# Patient Record
Sex: Male | Born: 1995 | Race: White | Hispanic: No | Marital: Single | Smoking: Never smoker
Health system: Southern US, Community
[De-identification: ages and names within clinical notes are randomized; demographics above are authoritative.]

---
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
## 2018-12-17 ENCOUNTER — Other Ambulatory Visit: Payer: Self-pay

## 2018-12-17 DIAGNOSIS — Z20822 Contact with and (suspected) exposure to covid-19: Secondary | ICD-10-CM

## 2018-12-18 LAB — NOVEL CORONAVIRUS, NAA: SARS-CoV-2, NAA: NOT DETECTED

## 2019-03-25 ENCOUNTER — Ambulatory Visit
Admission: EM | Admit: 2019-03-25 | Discharge: 2019-03-25 | Disposition: A | Discharge status: Home / Self Care | Payer: BC Managed Care – PPO | Attending: Urgent Care | Admitting: Urgent Care

## 2019-03-25 ENCOUNTER — Other Ambulatory Visit: Payer: Self-pay

## 2019-03-25 DIAGNOSIS — T22212A Burn of second degree of left forearm, initial encounter: Secondary | ICD-10-CM

## 2019-03-25 DIAGNOSIS — X19XXXA Contact with other heat and hot substances, initial encounter: Secondary | ICD-10-CM

## 2019-03-25 MED ORDER — SILVER SULFADIAZINE 1 % EX CREA: TOPICAL_CREAM | Freq: Once | CUTANEOUS | Status: DC

## 2019-03-25 MED ORDER — SILVER SULFADIAZINE 1 % EX CREA: 1.0000 "application " | TOPICAL_CREAM | Freq: Two times a day (BID) | CUTANEOUS | 0 refills | Status: DC

## 2019-03-25 NOTE — Discharge Instructions (Signed)
It was very nice seeing you today in clinic. Thank you for entrusting me with your care.   Keep area clean and dry. Monitor for signs and symptoms of infection, which would include increased redness, swelling, streaking, drainage, pain, and the development of a fever. Apply cream TWICE a day for 7 days. Keep covered to keep cream in contact longer.    Make arrangements to follow up with your regular doctor in 1 week for re-evaluation if not improving. If your symptoms/condition worsens, please seek follow up care either here or in the ER. Please remember, our [facility] providers are "right here with you" when you need [place].   Again, it was my pleasure to take care of you today. Thank you for choosing our clinic. I hope that you start to feel better quickly.   Honor Loh, MSN, APRN, FNP-C, CEN Advanced Practice Provider [facility]

## 2019-03-25 NOTE — ED Triage Notes (Signed)
Patient states that he burned his arm on a machine at work on Saturday. Patient states that arm has been sore.

## 2019-03-27 NOTE — ED Provider Notes (Signed)
[place], [place]   Name: Jerry Long DOB: 12-23-1995 MRN: [id number] CSN: [id number] PCP: Patient, No Pcp Per  Arrival date and time:  03/25/19 1656  Chief Complaint:  Burn (left arm)   NOTE: Prior to seeing the patient today, I have reviewed the triage nursing documentation and vital signs. Clinical staff has updated patient's PMH/PSHx, current medication list, and drug allergies/intolerances to ensure comprehensive history available to assist in medical decision making.   History:   HPI: Jerry Long is a 23 y.o. male who presents today with complaints of with a burn to the distal aspect of his LEFT forearm. Patient reports that he was performing his job duties today at [employer] when the injury occurred, however does to wish to file this under a worker's compensation claim. He advises that he was using a new piece of equipment at work that he was not familiar with. In the process of working with the machine, his arm came into contact with a part that was hot, thus causing a thermal injury to the aforementioned location. Injury occurred on Saturday 03/21/2019. Patient notes that his tattoo appeared different today; skin ruptured and rolled edges. He has been applying TAO and using antiseptic wipes. He presents for evaluation to ensure proper healing and no infection.   History reviewed. No pertinent past medical history.  History reviewed. No pertinent surgical history.  History reviewed. No pertinent family history.  Social History   Tobacco Use  . Smoking status: Never Smoker  . Smokeless tobacco: Never Used  Substance Use Topics  . Alcohol use: Not Currently  . Drug use: Not Currently    There are no problems to display for this patient.   Home Medications:    No outpatient medications have been marked as taking for the 03/25/19 encounter [name]).    Allergies:   Patient has no known allergies.  Review of Systems (ROS): Review of Systems    Constitutional: Negative for chills and fever.  Respiratory: Negative for cough and shortness of breath.   Cardiovascular: Negative for chest pain and palpitations.  Skin: Positive for color change and wound.  All other systems reviewed and are negative.    Vital Signs: Today's Vitals   03/25/19 1725 03/25/19 1727  BP:  (!) 141/82  Pulse:  (!) 103  Resp:  16  Temp:  99.2 F (37.3 C)  TempSrc:  Oral  SpO2:  100%  Weight: 150 lb (68 kg)   Height: 5\' 3"  (1.6 m)   PainSc: 2      Physical Exam: Physical Exam  Constitutional: He is oriented to person, place, and time and well-developed, well-nourished, and in no distress.  HENT:  Head: Normocephalic and atraumatic.  Mouth/Throat: Mucous membranes are normal.  Eyes: Pupils are equal, round, and reactive to light.  Cardiovascular: Intact distal pulses. Tachycardia present.  Pulmonary/Chest: Effort normal. No respiratory distress.  Neurological: He is alert and oriented to person, place, and time. Gait normal.  Skin: Skin is warm and dry. Burn (see marked location) noted. No rash noted.     Partial thickness burn to distal LFA. Sensation intact. No blister; ? ruptured already. Skin rolled causing distortion to underlying tattoo. Pain minimal. (+) slight surrounding erythema.   Psychiatric: Mood, memory, affect and judgment normal.  Nursing note and vitals reviewed.   Urgent Care Treatments / Results:   Orders Placed This Encounter  Procedures  . Apply dressing    LABS: PLEASE NOTE: all labs that  were ordered this encounter are listed, however only abnormal results are displayed. Labs Reviewed - No data to display  EKG: -None  RADIOLOGY: No results found.  PROCEDURES: Procedures  MEDICATIONS RECEIVED THIS VISIT: Medications - No data to display  PERTINENT CLINICAL COURSE NOTES/UPDATES:   Initial Impression / Assessment and Plan / Urgent Care Course:  Pertinent labs & imaging results that were available during  my care of the patient were personally reviewed by me and considered in my medical decision making (see lab/imaging section of note for values and interpretations).  Jerry Long is a 23 y.o. male who presents to [facility] today with complaints of Burn (left arm)   Patient is well appearing overall in clinic today. He does not appear to be in any acute distress. Presenting symptoms (see HPI) and exam as documented above. Exam consistent with healing partial thickness burn that occurred several days ago. Wound changed today after using an antiseptic wipe. Pain minimal; sensation intact. Discussed potential for infection with wound essentially being open at this point. Wound cleansed and dressing in clinic by CMA; SSD 1% applied. Patient educated on need for daily wound care. He was encouraged to keep wound clean and dry. He was instructed to apply SSD 1% cream as prescribed. Wound may be left open to air while at home, however he was encouraged to cover area while in public to prevent infection. Patient to monitor for signs and symptoms of infection, which would include increased redness, swelling, streaking, drainage, pain, and the development of a fever. May use Tylenol and/or Ibuprofen as needed for pain.  Discussed follow up with primary care physician in 1 week for re-evaluation if not improving. I have reviewed the follow up and strict return precautions for any new or worsening symptoms. Patient is aware of symptoms that would be deemed urgent/emergent, and would thus require further evaluation either here or in the emergency department. At the time of discharge, he verbalized understanding and consent with the discharge plan as it was reviewed with him. All questions were fielded by provider and/or clinic staff prior to patient discharge.    Final Clinical Impressions / Urgent Care Diagnoses:   Final diagnoses:  Partial thickness burn of left forearm, initial encounter    New  Prescriptions:  [place] Controlled Substance Registry consulted? Not Applicable    Meds ordered this encounter  Medications  . silver sulfADIAZINE (SILVADENE) 1 % cream    Sig: Apply 1 application topically 2 (two) times daily. X 7 days    Dispense:  85 g    Refill:  0  . silver sulfADIAZINE (SILVADENE) 1 % cream   Recommended Follow up Care:  Patient encouraged to follow up with the following provider within the specified time frame, or sooner as dictated by the severity of his symptoms. As always, he was instructed that for any urgent/emergent care needs, he should seek care either here or in the emergency department for more immediate evaluation.  Follow-up Information    PCP In 1 week.   Why: General reassessment of symptoms if not improving        NOTE: This note was prepared using Lobbyist along with smaller Company secretary. Despite my best ability to proofread, there is the potential that transcriptional errors may still occur from this process, and are completely unintentional.    Karen Kitchens, NP 03/27/19 1120

## 2019-06-22 ENCOUNTER — Other Ambulatory Visit: Payer: Self-pay

## 2019-06-22 ENCOUNTER — Ambulatory Visit
Admission: EM | Admit: 2019-06-22 | Discharge: 2019-06-22 | Disposition: A | Discharge status: Home / Self Care | Payer: BC Managed Care – PPO | Attending: Family Medicine | Admitting: Family Medicine

## 2019-06-22 DIAGNOSIS — L0591 Pilonidal cyst without abscess: Secondary | ICD-10-CM | POA: Diagnosis not present

## 2019-06-22 MED ORDER — AMOXICILLIN-POT CLAVULANATE 875-125 MG PO TABS: 1.0000 | ORAL_TABLET | Freq: Two times a day (BID) | ORAL | 0 refills | Status: DC

## 2019-06-22 NOTE — ED Provider Notes (Signed)
[facility]    CSN: [id number] Arrival date & time: 06/22/19  0934      History   Chief Complaint Chief Complaint  Patient presents with  . Rectal Pain    HPI Jerry Long is a 24 y.o. male.   24 yo male with a c/o pus drainage and tenderness in the tailbone area, just above the anus. Denies any injuries, fevers, chills. States he's been doing bathtub soaks and using baby wipes. Denies any prior history of similar problems.      History reviewed. No pertinent past medical history.  There are no problems to display for this patient.   Past Surgical History:  Procedure Laterality Date  . HERNIA REPAIR  1998   per patient        Home Medications    Prior to Admission medications   Medication Sig Start Date End Date Taking? Authorizing Provider  amoxicillin-clavulanate (AUGMENTIN) 875-125 MG tablet Take 1 tablet by mouth 2 (two) times daily. 06/22/19   Norval Gable, MD  silver sulfADIAZINE (SILVADENE) 1 % cream Apply 1 application topically 2 (two) times daily. X 7 days 03/25/19   Karen Kitchens, NP    Family History History reviewed. No pertinent family history.  Social History Social History   Tobacco Use  . Smoking status: Never Smoker  . Smokeless tobacco: Never Used  Substance Use Topics  . Alcohol use: Yes    Comment: rarely   . Drug use: Not Currently     Allergies   Patient has no known allergies.   Review of Systems Review of Systems   Physical Exam Triage Vital Signs ED Triage Vitals  Enc Vitals Group     BP 06/22/19 0946 (!) 141/97     Pulse Rate 06/22/19 0946 (!) 106     Resp 06/22/19 0946 18     Temp 06/22/19 0946 98.7 F (37.1 C)     Temp Source 06/22/19 0946 Oral     SpO2 06/22/19 0946 99 %     Weight --      Height --      Head Circumference --      Peak Flow --      Pain Score 06/22/19 0944 5     Pain Loc --      Pain Edu? --      Excl. in [place]? --    No data found.  Updated Vital Signs BP  (!) 141/97 (BP Location: Right Arm)   Pulse (!) 106   Temp 98.7 F (37.1 C) (Oral)   Resp 18   SpO2 99%   Visual Acuity Right Eye Distance:   Left Eye Distance:   Bilateral Distance:    Right Eye Near:   Left Eye Near:    Bilateral Near:     Physical Exam Vitals and nursing note reviewed.  Constitutional:      General: He is not in acute distress.    Appearance: He is not toxic-appearing or diaphoretic.  Genitourinary:    Comments: Pinpoint, oval wound at sacral skin area with purulent drainage, and surrounding blanchable erythema with tenderness to palpation Neurological:     Mental Status: He is alert.      UC Treatments / Results  Labs (all labs ordered are listed, but only abnormal results are displayed) Labs Reviewed - No data to display  EKG   Radiology No results found.  Procedures Procedures (including critical care time)  Medications Ordered in UC Medications -  No data to display  Initial Impression / Assessment and Plan / UC Course  I have reviewed the triage vital signs and the nursing notes.  Pertinent labs & imaging results that were available during my care of the patient were reviewed by me and considered in my medical decision making (see chart for details).      Final Clinical Impressions(s) / UC Diagnoses   Final diagnoses:  Infected pilonidal cyst     Discharge Instructions     Warm compresses to area    ED Prescriptions    Medication Sig Dispense Auth. Provider   amoxicillin-clavulanate (AUGMENTIN) 875-125 MG tablet Take 1 tablet by mouth 2 (two) times daily. 20 tablet Payton Mccallum, MD     1.diagnosis reviewed with patient 2. rx as per orders above; reviewed possible side effects, interactions, risks and benefits  3. Recommend supportive treatment sitz baths, warm compresses, over the counter analgesics as needed 4. Follow-up prn if symptoms worsen or don't improve   PDMP not reviewed this encounter.   Payton Mccallum, MD 06/22/19 1459

## 2019-06-22 NOTE — Discharge Instructions (Signed)
Warm compresses to area °

## 2019-06-22 NOTE — ED Triage Notes (Signed)
Pt states he feels like he has another hole near rectum that he noticed about one week ago.  Reports dishcarge.  Worsening pain.  Warm bath did not relieve pain.  Had one BM with bright red blood.  Painful BMs with "pressure".

## 2019-08-11 DIAGNOSIS — L0591 Pilonidal cyst without abscess: Secondary | ICD-10-CM

## 2019-08-11 HISTORY — DX: Pilonidal cyst without abscess: L05.91

## 2021-08-31 ENCOUNTER — Emergency Department: Payer: BC Managed Care – PPO

## 2021-08-31 ENCOUNTER — Other Ambulatory Visit: Payer: Self-pay

## 2021-08-31 ENCOUNTER — Emergency Department
Admission: EM | Admit: 2021-08-31 | Discharge: 2021-08-31 | Disposition: A | Discharge status: Home / Self Care | Payer: BC Managed Care – PPO | Attending: Emergency Medicine | Admitting: Emergency Medicine

## 2021-08-31 DIAGNOSIS — R1031 Right lower quadrant pain: Secondary | ICD-10-CM | POA: Diagnosis present

## 2021-08-31 DIAGNOSIS — N200 Calculus of kidney: Secondary | ICD-10-CM | POA: Diagnosis not present

## 2021-08-31 LAB — COMPREHENSIVE METABOLIC PANEL
ALT: 18 U/L (ref 0–44)
AST: 25 U/L (ref 15–41)
Albumin: 4.7 g/dL (ref 3.5–5.0)
Alkaline Phosphatase: 59 U/L (ref 38–126)
Anion gap: 11 (ref 5–15)
BUN: 16 mg/dL (ref 6–20)
CO2: 21 mmol/L — ABNORMAL LOW (ref 22–32)
Calcium: 9.8 mg/dL (ref 8.9–10.3)
Chloride: 109 mmol/L (ref 98–111)
Creatinine, Ser: 1.06 mg/dL (ref 0.61–1.24)
GFR, Estimated: >60 mL/min (ref >60)
Glucose, Bld: 150 mg/dL — ABNORMAL HIGH (ref 70–99)
Potassium: 3.6 mmol/L (ref 3.5–5.1)
Sodium: 141 mmol/L (ref 135–145)
Total Bilirubin: 1.1 mg/dL (ref 0.3–1.2)
Total Protein: 7.9 g/dL (ref 6.5–8.1)

## 2021-08-31 LAB — URINALYSIS, ROUTINE W REFLEX MICROSCOPIC
Bacteria, UA: NONE SEEN
Bilirubin Urine: NEGATIVE
Glucose, UA: NEGATIVE mg/dL
Ketones, ur: NEGATIVE mg/dL
Leukocytes,Ua: NEGATIVE
Nitrite: NEGATIVE
Protein, ur: NEGATIVE mg/dL
RBC / HPF: >50 RBC/hpf — ABNORMAL HIGH (ref 0–5)
Specific Gravity, Urine: >1.046 — ABNORMAL HIGH (ref 1.005–1.030)
Squamous Epithelial / HPF: NONE SEEN (ref 0–5)
pH: 7 (ref 5.0–8.0)

## 2021-08-31 LAB — CBC
HCT: 46.4 % (ref 39.0–52.0)
Hemoglobin: 16.1 g/dL (ref 13.0–17.0)
MCH: 28.8 pg (ref 26.0–34.0)
MCHC: 34.7 g/dL (ref 30.0–36.0)
MCV: 82.9 fL (ref 80.0–100.0)
Platelets: 277 10*3/uL (ref 150–400)
RBC: 5.6 MIL/uL (ref 4.22–5.81)
RDW: 12.4 % (ref 11.5–15.5)
WBC: 10.2 10*3/uL (ref 4.0–10.5)
nRBC: 0 % (ref 0.0–0.2)

## 2021-08-31 LAB — LIPASE, BLOOD: Lipase: 28 U/L (ref 11–51)

## 2021-08-31 MED ORDER — KETOROLAC TROMETHAMINE 30 MG/ML IJ SOLN
30.0000 mg | Freq: Once | INTRAMUSCULAR | Status: AC
  Administered 2021-08-31: 30 mg via INTRAVENOUS
  Filled 2021-08-31: qty 1

## 2021-08-31 MED ORDER — ONDANSETRON HCL 4 MG/2ML IJ SOLN
4.0000 mg | Freq: Once | INTRAMUSCULAR | Status: AC
  Administered 2021-08-31: 4 mg via INTRAVENOUS
  Filled 2021-08-31: qty 2

## 2021-08-31 MED ORDER — MORPHINE SULFATE (PF) 4 MG/ML IV SOLN
4.0000 mg | Freq: Once | INTRAVENOUS | Status: AC
  Administered 2021-08-31: 4 mg via INTRAVENOUS
  Filled 2021-08-31: qty 1

## 2021-08-31 MED ORDER — OXYCODONE-ACETAMINOPHEN 5-325 MG PO TABS: 1.0000 | ORAL_TABLET | ORAL | 0 refills | Status: DC | PRN

## 2021-08-31 MED ORDER — TAMSULOSIN HCL 0.4 MG PO CAPS: 0.4000 mg | ORAL_CAPSULE | Freq: Every day | ORAL | 0 refills | Status: DC

## 2021-08-31 MED ORDER — SODIUM CHLORIDE 0.9 % IV BOLUS
1000.0000 mL | Freq: Once | INTRAVENOUS | Status: AC
  Administered 2021-08-31: 1000 mL via INTRAVENOUS

## 2021-08-31 MED ORDER — IOHEXOL 300 MG/ML  SOLN
100.0000 mL | Freq: Once | INTRAMUSCULAR | Status: AC | PRN
  Administered 2021-08-31: 100 mL via INTRAVENOUS

## 2021-08-31 NOTE — ED Triage Notes (Signed)
Pt here with RLQ abd pain that started this morning. Pt denies N/V/D but states he had 1 episode of dry heaving. Pt in triage doubled over in pain. Pt is constant and does not radiate.

## 2021-08-31 NOTE — ED Notes (Addendum)
Pt resting on stretcher alert and talkative. States nausea and pain has improved since zofran was administered. Pt encouraged to provide urine sample. Pt verbalized understanding. Provided for comfort and safety and will continue to assess.

## 2021-08-31 NOTE — ED Notes (Signed)
Pt to CT

## 2021-08-31 NOTE — ED Notes (Signed)
MD at the bedside for pt evaluation  

## 2021-08-31 NOTE — ED Provider Notes (Signed)
[facility] Provider Note    Event Date/Time   First MD Initiated Contact with Patient 08/31/21 [phone]     (approximate)  History   Chief Complaint: Abdominal Pain  HPI  Jerry Long is a 26 y.o. male with no past medical history who presents emergency department for right lower quadrant abdominal pain.  According to the patient he woke around 4:00 this morning with right lower quadrant abdominal pain which he states is moderate in severity as well as nausea and vomiting.  No diarrhea.  No fever.  No history of similar pain.  No abdominal surgeries in the past, no history of kidney stones.  No dysuria or hematuria.  Physical Exam   Triage Vital Signs: ED Triage Vitals [08/31/21 0812]  Enc Vitals Group     BP 139/82     Pulse Rate 77     Resp 18     Temp 97.6 F (36.4 C)     Temp Source Oral     SpO2 97 %     Weight 170 lb (77.1 kg)     Height 5\' 3"  (1.6 m)     Head Circumference      Peak Flow      Pain Score 9     Pain Loc      Pain Edu?      Excl. in [place]?     Most recent vital signs: Vitals:   08/31/21 0812  BP: 139/82  Pulse: 77  Resp: 18  Temp: 97.6 F (36.4 C)  SpO2: 97%    General: Awake, no distress.  CV:  Good peripheral perfusion.  Regular rate and rhythm  Resp:  Normal effort.  Equal breath sounds bilaterally.  Abd:  No distention.  Soft, moderate tenderness to the right lower quadrant without rebound or guarding.  Abdomen otherwise benign    ED Results / Procedures / Treatments   RADIOLOGY  I have reviewed the CT images patient appears to have a mid ureteral stone on the right side. Radiology is read the CT scan is a 6 x 3 cm mid ureteral stone.   MEDICATIONS ORDERED IN ED: Medications  ondansetron (ZOFRAN) injection 4 mg (4 mg Intravenous Given 08/31/21 10/31/21)     IMPRESSION / MDM / ASSESSMENT AND PLAN / ED COURSE  I reviewed the triage vital signs and the nursing notes.  Patient's presentation is most  consistent with acute complicated illness / injury requiring diagnostic workup.  Patient presents to the emergency department for right lower quadrant abdominal pain.  Patient has right lower quadrant abdominal tenderness to palpation.  No rebound or guarding.  Differential would include appendicitis, ureterolithiasis, colitis or diverticulitis, UTI or pyelonephritis.  We will check labs, urinalysis and obtain CT imaging.  We will treat pain nausea and IV hydrate while awaiting results.  Patient's work-up is most consistent with a right-sided ureteral stone.  Patient's urinalysis shows no sign of infection.  Normal CBC with a normal white blood cell count.  Reassuring chemistry.  Patient states he is now pain-free after Toradol.  We will discharge with a short course of Percocet, Flomax, have the patient follow-up with urology.  We will provide a urine strainer.  I discussed my typical kidney stone return precautions.   FINAL CLINICAL IMPRESSION(S) / ED DIAGNOSES   Right lower quadrant abdominal pain Kidney stone    Note:  This document was prepared using Dragon voice recognition software and may include unintentional dictation errors.  Minna Antis, MD 08/31/21 1254

## 2021-08-31 NOTE — ED Provider Notes (Incomplete)
   [facility] Provider Note    Event Date/Time   First MD Initiated Contact with Patient 08/31/21 [phone]     (approximate)  History   Chief Complaint: Abdominal Pain  HPI  Jerry Long is a 26 y.o. male  ***   {No past medical history on file.:1} Physical Exam   Triage Vital Signs: ED Triage Vitals [08/31/21 0812]  Enc Vitals Group     BP 139/82     Pulse Rate 77     Resp 18     Temp 97.6 F (36.4 C)     Temp Source Oral     SpO2 97 %     Weight 170 lb (77.1 kg)     Height 5\' 3"  (1.6 m)     Head Circumference      Peak Flow      Pain Score 9     Pain Loc      Pain Edu?      Excl. in [place]?     Most recent vital signs: Vitals:   08/31/21 0812  BP: 139/82  Pulse: 77  Resp: 18  Temp: 97.6 F (36.4 C)  SpO2: 97%    General: Awake, no distress. *** CV:  Good peripheral perfusion.  Regular rate and rhythm *** Resp:  Normal effort.  Equal breath sounds bilaterally. *** Abd:  No distention.  Soft, nontender.  No rebound or guarding.*** Other:  ***   ED Results / Procedures / Treatments   EKG  ***  RADIOLOGY  ***   MEDICATIONS ORDERED IN ED: Medications  ondansetron (ZOFRAN) injection 4 mg (4 mg Intravenous Given 08/31/21 [id number])     IMPRESSION / MDM / ASSESSMENT AND PLAN / ED COURSE  I reviewed the triage vital signs and the nursing notes.  Patient's presentation is most consistent with {EM COPA:27473}  ***  FINAL CLINICAL IMPRESSION(S) / ED DIAGNOSES   ***  Rx / DC Orders   ***  Note:  This document was prepared using Dragon voice recognition software and may include unintentional dictation errors.

## 2021-09-01 LAB — URINE CULTURE: Culture: NO GROWTH

## 2021-09-05 ENCOUNTER — Other Ambulatory Visit: Payer: Self-pay | Admitting: *Deleted

## 2021-09-05 ENCOUNTER — Ambulatory Visit (INDEPENDENT_AMBULATORY_CARE_PROVIDER_SITE_OTHER): Payer: BC Managed Care – PPO | Admitting: Urology

## 2021-09-05 ENCOUNTER — Ambulatory Visit
Admission: RE | Admit: 2021-09-05 | Discharge: 2021-09-05 | Disposition: A | Discharge status: Home / Self Care | Payer: BC Managed Care – PPO | Attending: Urology | Admitting: Urology

## 2021-09-05 ENCOUNTER — Ambulatory Visit
Admission: RE | Admit: 2021-09-05 | Discharge: 2021-09-05 | Disposition: A | Discharge status: Home / Self Care | Payer: BC Managed Care – PPO | Source: Ambulatory Visit | Attending: Urology | Admitting: Urology

## 2021-09-05 ENCOUNTER — Encounter: Payer: Self-pay | Admitting: Urology

## 2021-09-05 VITALS — BP 115/74 | HR 70 | Ht 63.0 in | Wt 167.0 lb

## 2021-09-05 DIAGNOSIS — N200 Calculus of kidney: Secondary | ICD-10-CM

## 2021-09-05 NOTE — Patient Instructions (Signed)
Kidney Stones  Kidney stones are solid, rock-like deposits that form inside of the kidneys. The kidneys are a pair of organs that make urine. A kidney stone may form in a kidney and move into other parts of the urinary tract, including the tubes that connect the kidneys to the bladder (ureters), the bladder, and the tube that carries urine out of the body (urethra). As the stone moves through these areas, it can cause intense pain and block the flow of urine. Kidney stones are created when high levels of certain minerals are found in the urine. The stones are usually passed out of the body through urination, but in some cases, medical treatment may be needed to remove them. What are the causes? Kidney stones may be caused by: A condition in which certain glands produce too much parathyroid hormone (primary hyperparathyroidism), which causes too much calcium buildup in the blood. A buildup of uric acid crystals in the bladder (hyperuricosuria). Uric acid is a chemical that the body produces when you eat certain foods. It usually exits the body in the urine. Narrowing (stricture) of one or both of the ureters. A kidney blockage that is present at birth (congenital obstruction). Past surgery on the kidney or the ureters, such as gastric bypass surgery. What increases the risk? The following factors may make you more likely to develop this condition: Having had a kidney stone in the past. Having a family history of kidney stones. Not drinking enough water. Eating a diet that is high in protein, salt (sodium), or sugar. Being overweight or obese. What are the signs or symptoms? Symptoms of a kidney stone may include: Pain in the side of the abdomen, right below the ribs (flank pain). Pain usually spreads (radiates) to the groin. Needing to urinate frequently or urgently. Painful urination. Blood in the urine (hematuria). Nausea. Vomiting. Fever and chills. How is this diagnosed? This condition  may be diagnosed based on: Your symptoms and medical history. A physical exam. Blood tests. Urine tests. These may be done before and after the stone passes out of your body through urination. Imaging tests, such as a CT scan, abdominal X-ray, or ultrasound. A procedure to examine the inside of the bladder (cystoscopy). How is this treated? Treatment for kidney stones depends on the size, location, and makeup of the stones. Kidney stones will often pass out of the body through urination. You may need to: Increase your fluid intake to help pass the stone. In some cases, you may be given fluids through an IV and may need to be monitored at the hospital. Take medicine for pain. Make changes in your diet to help prevent kidney stones from coming back. Sometimes, medical procedures are needed to remove a kidney stone. This may involve: A procedure to break up kidney stones using: A focused beam of light (laser therapy). Shock waves (extracorporeal shock wave lithotripsy). Surgery to remove kidney stones. This may be needed if you have severe pain or have stones that block your urinary tract. Follow these instructions at home: Medicines Take over-the-counter and prescription medicines only as told by your health care provider. Ask your health care provider if the medicine prescribed to you requires you to avoid driving or using heavy machinery. Eating and drinking Drink enough fluid to keep your urine pale yellow. You may be instructed to drink at least 8-10 glasses of water each day. This will help you pass the kidney stone. If directed, change your diet. This may include: Limiting how much   sodium you eat. Eating more fruits and vegetables. Limiting how much animal protein--such as red meat, poultry, fish, and eggs--you eat. Follow instructions from your health care provider about eating or drinking restrictions. General instructions Collect urine samples as told by your health care provider.  You may need to collect a urine sample: 24 hours after you pass the stone. 8-12 weeks after passing the kidney stone, and every 6-12 months after that. Strain your urine every time you urinate, for as long as directed. Use the strainer that your health care provider recommends. Do not throw out the kidney stone after passing it. Keep the stone so it can be tested by your health care provider. Testing the makeup of your kidney stone may help prevent you from getting kidney stones in the future. Keep all follow-up visits as told by your health care provider. This is important. You may need follow-up X-rays or ultrasounds to make sure that your stone has passed. How is this prevented? To prevent another kidney stone: Drink enough fluid to keep your urine pale yellow. This is the best way to prevent kidney stones. Eat a healthy diet and follow recommendations from your health care provider about foods to avoid. You may be instructed to eat a low-protein diet. Recommendations vary depending on the type of kidney stone that you have. Maintain a healthy weight. Where to find more information [place] [facility]): www.kidney.[facility]): www.urologyhealth.org Contact a health care provider if: You have pain that gets worse or does not get better with medicine. Get help right away if: You have a fever or chills. You develop severe pain. You develop new abdominal pain. You faint. You are unable to urinate. Summary Kidney stones are solid, rock-like deposits that form inside of the kidneys. Kidney stones can cause nausea, vomiting, blood in the urine, abdominal pain, and the urge to urinate frequently. Treatment for kidney stones depends on the size, location, and makeup of the stones. Kidney stones will often pass out of the body through urination. Kidney stones can be prevented by drinking enough fluids, eating a healthy diet, and maintaining a healthy weight. This  information is not intended to replace advice given to you by your health care provider. Make sure you discuss any questions you have with your health care provider. Document Revised: 12/07/2020 Document Reviewed: 11/21/2020 Elsevier Patient Education  [employer].  Dietary Guidelines to Help Prevent Kidney Stones Kidney stones are deposits of minerals and salts that form inside your kidneys. Your risk of developing kidney stones may be greater depending on your diet, your lifestyle, the medicines you take, and whether you have certain medical conditions. Most people can lower their chances of developing kidney stones by following the instructions below. Your dietitian may give you more specific instructions depending on your overall health and the type of kidney stones you tend to develop. What are tips for following this plan? Reading food labels  Choose foods with "no salt added" or "low-salt" labels. Limit your salt (sodium) intake to less than 1,500 mg a day. Choose foods with calcium for each meal and snack. Try to eat about 300 mg of calcium at each meal. Foods that contain 200-500 mg of calcium a serving include: 8 oz (237 mL) of milk, calcium-fortifiednon-dairy milk, and calcium-fortifiedfruit juice. Calcium-fortified means that calcium has been added to these drinks. 8 oz (237 mL) of kefir, yogurt, and soy yogurt. 4 oz (114 g) of tofu. 1 oz (28 g) of  cheese. 1 cup (150 g) of dried figs. 1 cup (91 g) of cooked broccoli. One 3 oz (85 g) can of sardines or mackerel. Most people need 1,000-1,500 mg of calcium a day. Talk to your dietitian about how much calcium is recommended for you. Shopping Buy plenty of fresh fruits and vegetables. Most people do not need to avoid fruits and vegetables, even if these foods contain nutrients that may contribute to kidney stones. When shopping for convenience foods, choose: Whole pieces of fruit. Pre-made salads with dressing on the  side. Low-fat fruit and yogurt smoothies. Avoid buying frozen meals or prepared deli foods. These can be high in sodium. Look for foods with live cultures, such as yogurt and kefir. Choose high-fiber grains, such as whole-wheat breads, oat bran, and wheat cereals. Cooking Do not add salt to food when cooking. Place a salt shaker on the table and allow each person to add his or her own salt to taste. Use vegetable protein, such as beans, textured vegetable protein (TVP), or tofu, instead of meat in pasta, casseroles, and soups. Meal planning Eat less salt, if told by your dietitian. To do this: Avoid eating processed or pre-made food. Avoid eating fast food. Eat less animal protein, including cheese, meat, poultry, or fish, if told by your dietitian. To do this: Limit the number of times you have meat, poultry, fish, or cheese each week. Eat a diet free of meat at least 2 days a week. Eat only one serving each day of meat, poultry, fish, or seafood. When you prepare animal protein, cut pieces into small portion sizes. For most meat and fish, one serving is about the size of the palm of your hand. Eat at least five servings of fresh fruits and vegetables each day. To do this: Keep fruits and vegetables on hand for snacks. Eat one piece of fruit or a handful of berries with breakfast. Have a salad and fruit at lunch. Have two kinds of vegetables at dinner. Limit foods that are high in a substance called oxalate. These include: Spinach (cooked), rhubarb, beets, sweet potatoes, and Swiss chard. Peanuts. Potato chips, french fries, and baked potatoes with skin on. Nuts and nut products. Chocolate. If you regularly take a diuretic medicine, make sure to eat at least 1 or 2 servings of fruits or vegetables that are high in potassium each day. These include: Avocado. Banana. Orange, prune, carrot, or tomato juice. Baked potato. Cabbage. Beans and split peas. Lifestyle  Drink enough fluid  to keep your urine pale yellow. This is the most important thing you can do. Spread your fluid intake throughout the day. If you drink alcohol: Limit how much you use to: 0-1 drink a day for women who are not pregnant. 0-2 drinks a day for men. Be aware of how much alcohol is in your drink. In the [place] one drink equals one 12 oz bottle of beer (355 mL), one 5 oz glass of wine (148 mL), or one 1 oz glass of hard liquor (44 mL). Lose weight if told by your health care provider. Work with your dietitian to find an eating plan and weight loss strategies that work best for you. General information Talk to your health care provider and dietitian about taking daily supplements. You may be told the following depending on your health and the cause of your kidney stones: Not to take supplements with vitamin C. To take a calcium supplement. To take a daily probiotic supplement. To take other supplements such  as magnesium, fish oil, or vitamin B6. Take over-the-counter and prescription medicines only as told by your health care provider. These include supplements. What foods should I limit? Limit your intake of the following foods, or eat them as told by your dietitian. Vegetables Spinach. Rhubarb. Beets. Canned vegetables. Rosita FirePickles. Olives. Baked potatoes with skin. Grains Wheat bran. Baked goods. Salted crackers. Cereals high in sugar. Meats and other proteins Nuts. Nut butters. Large portions of meat, poultry, or fish. Salted, precooked, or cured meats, such as sausages, meat loaves, and hot dogs. Dairy Cheese. Beverages Regular soft drinks. Regular vegetable juice. Seasonings and condiments Seasoning blends with salt. Salad dressings. Soy sauce. Ketchup. Barbecue sauce. Other foods Canned soups. Canned pasta sauce. Casseroles. Pizza. Lasagna. Frozen meals. Potato chips. [place] fries. The items listed above may not be a complete list of foods and beverages you should limit. Contact a dietitian  for more information. What foods should I avoid? Talk to your dietitian about specific foods you should avoid based on the type of kidney stones you have and your overall health. Fruits Grapefruit. The item listed above may not be a complete list of foods and beverages you should avoid. Contact a dietitian for more information. Summary Kidney stones are deposits of minerals and salts that form inside your kidneys. You can lower your risk of kidney stones by making changes to your diet. The most important thing you can do is drink enough fluid. Drink enough fluid to keep your urine pale yellow. Talk to your dietitian about how much calcium you should have each day, and eat less salt and animal protein as told by your dietitian. This information is not intended to replace advice given to you by your health care provider. Make sure you discuss any questions you have with your health care provider. Document Revised: 11/28/2020 Document Reviewed: 11/28/2020 Elsevier Patient Education  2023 Else[employer] Inc.  Laser Therapy for Kidney Stones Laser therapy for kidney stones is a procedure to break up small, hard mineral deposits that form in the kidney (kidney stones). The procedure is done using a device that produces a focused beam of light (laser). The laser breaks up kidney stones into pieces that are small enough to be passed out of the body through urination or removed from the body during the procedure. You may need laser therapy if you have kidney stones that are painful or block your urinary tract. This procedure is done by inserting a tube (ureteroscope) into your kidney through the urethral opening. The urethra is the part of the body that drains urine from the bladder. In women, the urethra opens above the vaginal opening. In men, the urethra opens at the tip of the penis. The ureteroscope is inserted through the urethra, and surgical instruments are moved through the bladder and the muscular tube  that connects the kidney to the bladder (ureter) until they reach the kidney. Tell a health care provider about: Any allergies you have. All medicines you are taking, including vitamins, herbs, eye drops, creams, and over-the-counter medicines. Any problems you or family members have had with anesthetic medicines. Any blood disorders you have. Any surgeries you have had. Any medical conditions you have. Whether you are pregnant or may be pregnant. What are the risks? Generally, this is a safe procedure. However, problems may occur, including: Infection. Bleeding. Allergic reactions to medicines. Damage to the urethra, bladder, or ureter. Urinary tract infection (UTI). Narrowing of the urethra (urethral stricture). Difficulty passing urine. Blockage of the kidney  caused by a fragment of kidney stone. What happens before the procedure? Medicines Ask your health care provider about: Changing or stopping your regular medicines. This is especially important if you are taking diabetes medicines or blood thinners. Taking medicines such as aspirin and ibuprofen. These medicines can thin your blood. Do not take these medicines unless your health care provider tells you to take them. Taking over-the-counter medicines, vitamins, herbs, and supplements. Eating and drinking Follow instructions from your health care provider about eating and drinking, which may include: 8 hours before the procedure - stop eating heavy meals or foods, such as meat, fried foods, or fatty foods. 6 hours before the procedure - stop eating light meals or foods, such as toast or cereal. 6 hours before the procedure - stop drinking milk or drinks that contain milk. 2 hours before the procedure - stop drinking clear liquids. Staying hydrated Follow instructions from your health care provider about hydration, which may include: Up to 2 hours before the procedure - you may continue to drink clear liquids, such as water,  clear fruit juice, black coffee, and plain tea.  General instructions You may have a physical exam before the procedure. You may also have tests, such as imaging tests and blood or urine tests. If your ureter is too narrow, your health care provider may place a soft, flexible tube (stent) inside of it. The stent may be placed days or weeks before your laser therapy procedure. Plan to have someone take you home from the hospital or clinic. If you will be going home right after the procedure, plan to have someone stay with you for 24 hours. Do not use any products that contain nicotine or tobacco for at least 4 weeks before the procedure. These products include cigarettes, e-cigarettes, and chewing tobacco. If you need help quitting, ask your health care provider. Ask your health care provider: How your surgical site will be marked or identified. What steps will be taken to help prevent infection. These may include: Removing hair at the surgery site. Washing skin with a germ-killing soap. Taking antibiotic medicine. What happens during the procedure?  An IV will be inserted into one of your veins. You will be given one or more of the following: A medicine to help you relax (sedative). A medicine to numb the area (local anesthetic). A medicine to make you fall asleep (general anesthetic). A ureteroscope will be inserted into your urethra. The ureteroscope will send images to a video screen in the operating room to guide your surgeon to the area of your kidney that will be treated. A small, flexible tube will be threaded through the ureteroscope and into your bladder and ureter, up to your kidney. The laser device will be inserted into your kidney through the tube. Your surgeon will pulse the laser on and off to break up kidney stones. A surgical instrument that has a tiny wire basket may be inserted through the tube into your kidney to remove the pieces of broken kidney stone. The procedure may  vary among health care providers and hospitals. What happens after the procedure? Your blood pressure, heart rate, breathing rate, and blood oxygen level will be monitored until you leave the hospital or clinic. You will be given pain medicine as needed. You may continue to receive antibiotics. You may have a stent temporarily placed in your ureter. Do not drive for 24 hours if you were given a sedative during your procedure. You may be given a strainer to  collect any stone fragments that you pass in your urine. Your health care provider may have these tested. Summary Laser therapy for kidney stones is a procedure to break up kidney stones into pieces that are small enough to be passed out of the body through urination or removed during the procedure. Follow instructions from your health care provider about eating and drinking before the procedure. During the procedure, the ureteroscope will send images to a video screen to guide your surgeon to the area of your kidney that will be treated. Do not drive for 24 hours if you were given a sedative during your procedure. This information is not intended to replace advice given to you by your health care provider. Make sure you discuss any questions you have with your health care provider. Document Revised: 11/21/2020 Document Reviewed: 11/21/2020 Elsevier Patient Education  2023 [employer].

## 2021-09-05 NOTE — Progress Notes (Signed)
   09/05/21 8:50 AM   Jerry Long 17-Sep-1995 [id number]  CC: Right ureteral stone  HPI: I saw Jerry Long today for evaluation of a right ureteral stone.  He presented to the ER on 08/31/2021 with acute onset of severe right flank and abdominal pain, with CT showing a 6 mm right mid ureteral stone with mild hydronephrosis.  Urinalysis was noninfected and he was discharged with medical expulsive therapy.  He reports complete resolution of his pain in the last 24 hours, and thinks he may have passed his stone.  He did not see a stone pass.  No prior stone episodes.   PMH: Past Medical History:  Diagnosis Date   Pilonidal cyst 08/11/2019   Formatting of this note might be different from the original. Added automatically from request for surgery [id number]    Surgical History: Past Surgical History:  Procedure Laterality Date   [place]   per patient    Family History: No family history on file.  Social History:  reports that he has never smoked. He has never used smokeless tobacco. He reports current alcohol use. He reports that he does not currently use drugs.  Physical Exam: BP 115/74   Pulse 70   Ht 5\' 3"  (1.6 m)   Wt 167 lb (75.8 kg)   BMI 29.58 kg/m    Constitutional:  Alert and oriented, No acute distress. Cardiovascular: No clubbing, cyanosis, or edema. Respiratory: Normal respiratory effort, no increased work of breathing. GI: Abdomen is soft, nontender, nondistended, no abdominal masses   Laboratory Data: Reviewed, see HPI  Pertinent Imaging: I have personally viewed and interpreted the CT showing a 6 mm right mid ureteral stone, 700HU, as well as the KUB today with no definite evidence of residual stone.  Assessment & Plan:   26 year old male who presented to the ER on 08/31/2021 with a 6 mm right mid ureteral stone, pain has resolved over the last 24 hours, and no stone visible on KUB today.  We discussed the possibility of persistent stone not  visualized on KUB with intermittent obstruction, and need for follow-up.  He would like to follow-up as needed, and return precautions discussed extensively.  Would need to consider right ureteroscopy and laser lithotripsy of recurrence of flank pain, if stone not visible on KUB to pursue shockwave.  We discussed general stone prevention strategies including adequate hydration with goal of producing 2.5 L of urine daily, increasing citric acid intake, increasing calcium intake during high oxalate meals, minimizing animal protein, and decreasing salt intake. Information about dietary recommendations given today.   Pain resolved and stone not visible on KUB, return precautions discussed extensively if recurrence of pain, and would recommend ureteroscopy if recurrence of right-sided flank pain  Nickolas Madrid, MD 09/05/2021  [facility] [address] [place], [place] [address] [phone]

## 2022-07-31 ENCOUNTER — Ambulatory Visit
Admission: EM | Admit: 2022-07-31 | Discharge: 2022-07-31 | Disposition: A | Discharge status: Home / Self Care | Payer: BC Managed Care – PPO | Attending: Family | Admitting: Family

## 2022-07-31 DIAGNOSIS — T2020XA Burn of second degree of head, face, and neck, unspecified site, initial encounter: Secondary | ICD-10-CM | POA: Diagnosis not present

## 2022-07-31 DIAGNOSIS — T22199A Burn of first degree of multiple sites of unspecified shoulder and upper limb, except wrist and hand, initial encounter: Secondary | ICD-10-CM | POA: Diagnosis not present

## 2022-07-31 MED ORDER — MUPIROCIN 2 % EX OINT: 1.0000 | TOPICAL_OINTMENT | Freq: Two times a day (BID) | CUTANEOUS | 0 refills | Status: AC

## 2022-07-31 MED ORDER — SILVER SULFADIAZINE 1 % EX CREA
1.0000 | TOPICAL_CREAM | Freq: Two times a day (BID) | CUTANEOUS | 0 refills | Status: AC
Start: 2022-07-31 — End: ?

## 2022-07-31 NOTE — ED Triage Notes (Signed)
Pt c/o sunburn all over body & blisters on top of head which caused him to become dizzy for the past 2 days. Did not apply sun scream. Denies any dizziness currently.
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
## 2022-07-31 NOTE — Discharge Instructions (Signed)
Recommend apply Bactroban ointment 2 to 3 times a day to blisters and open areas on top of head to help prevent infection. May use Silvadene cream- apply to areas on face and arms/neck twice a day to help with burn. Avoid blisters on top of head. May continue to apply cool compresses to area and may take OTC Benadryl or Zyquil at night to help with sleep. Follow-up in 3 to 4 days if not improving or sooner if worsening. Also call to establish care with a local Primary Care provider.

## 2023-08-03 IMAGING — CT CT ABD-PELV W/ CM
2 of 4 series · 16 of 46 positions shown, 18 images · IV contrast (APPLIED)
Comparison: None Available.

CLINICAL DATA: Right lower quadrant abdominal pain

EXAM:
CT ABDOMEN AND PELVIS WITH CONTRAST
TECHNIQUE: Multidetector CT imaging of the abdomen and pelvis was performed
using the standard protocol following bolus administration of
intravenous contrast.

[Series 2: abdomen 5.0 · axial · 0.75mm/px · z∈[-1009,-559]mm · 13 of 102 slices shown, 15 images]
[im 6/102  soft-tissue]
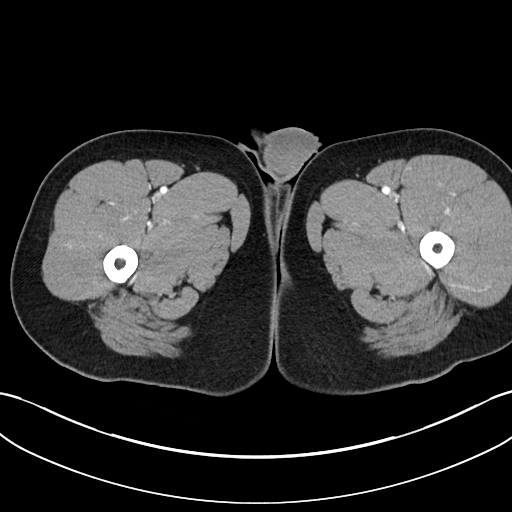
[im 6/102  bone]
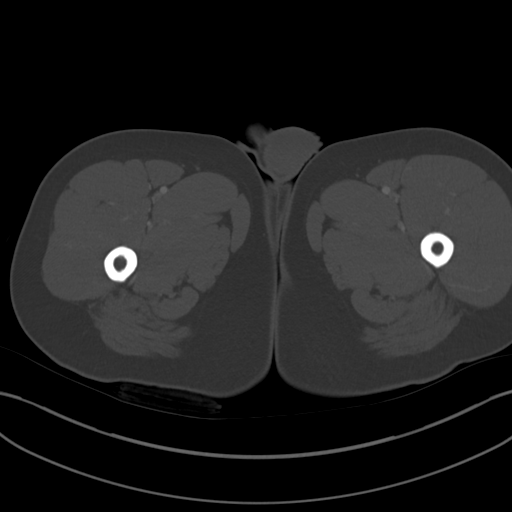
[im 16/102  soft-tissue]
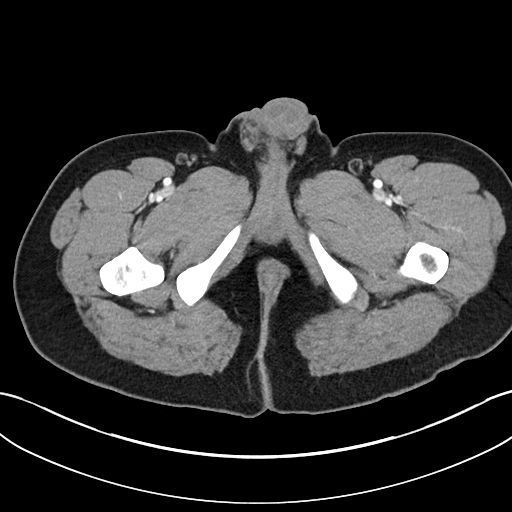
[im 22/102  soft-tissue]
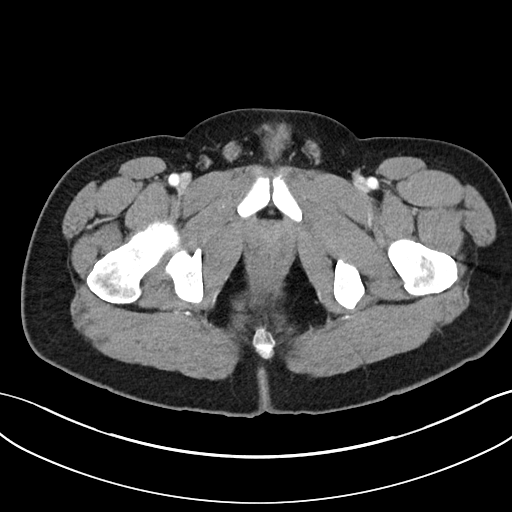
[im 27/102  soft-tissue]
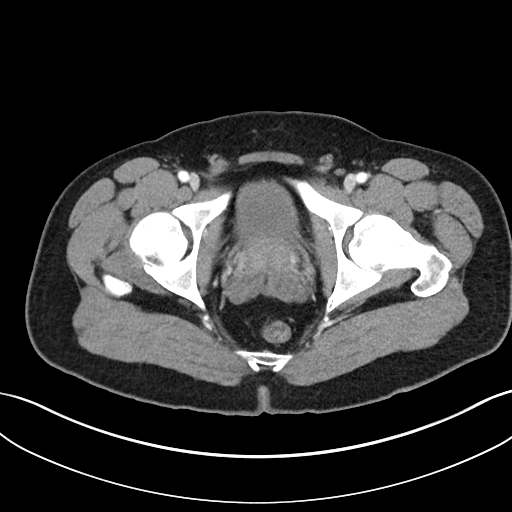
[im 38/102  soft-tissue]
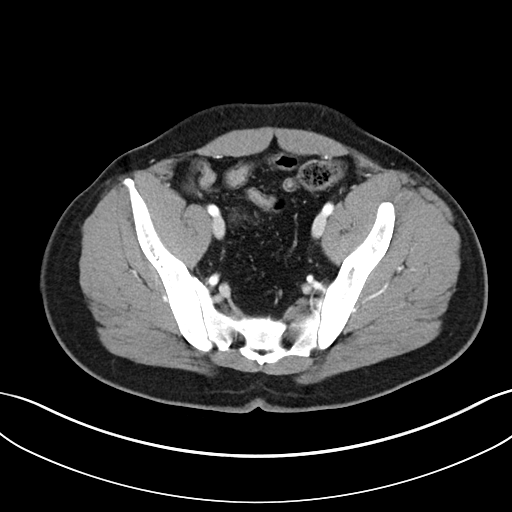
[im 43/102  soft-tissue]
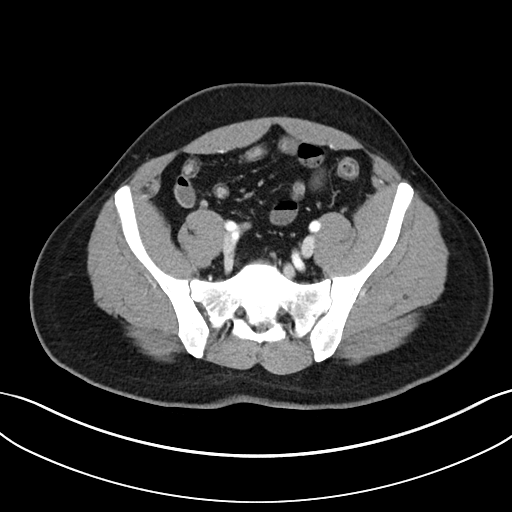
[im 54/102  soft-tissue]
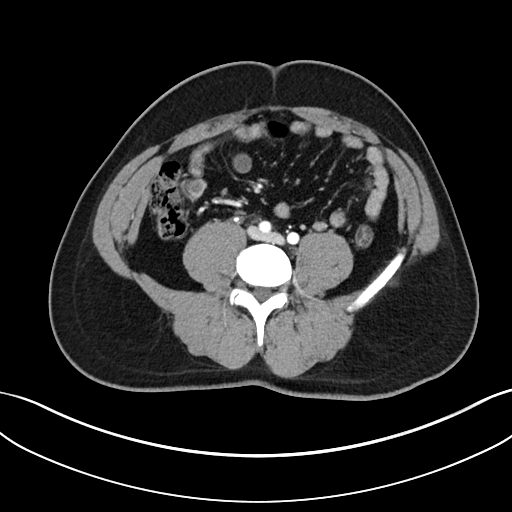
[im 59/102  soft-tissue]
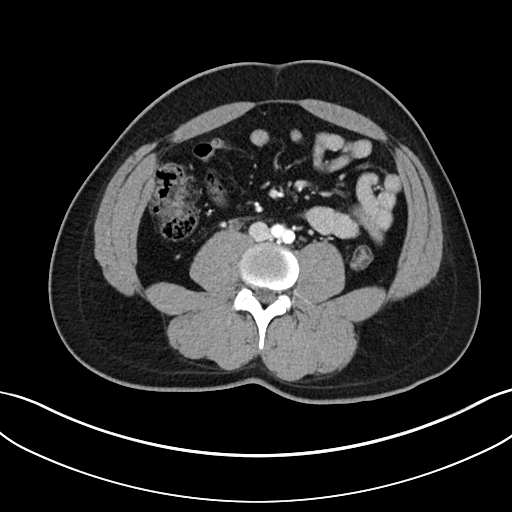
[im 64/102  soft-tissue]
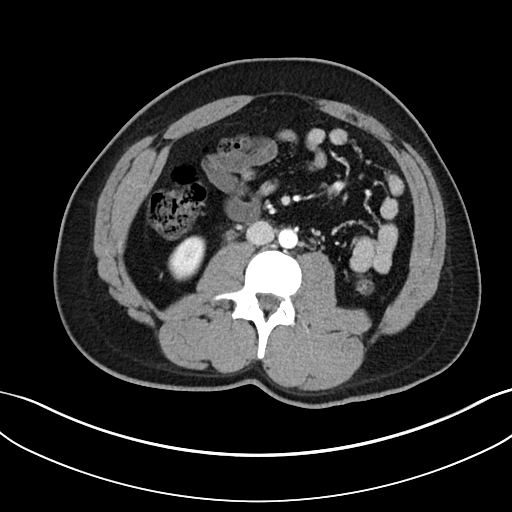
[im 64/102  bone]
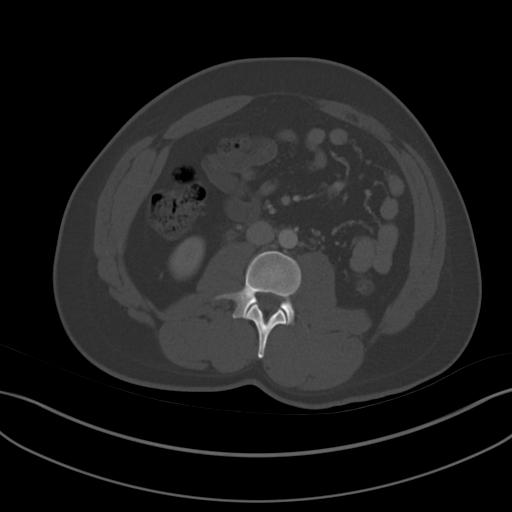
[im 75/102  soft-tissue]
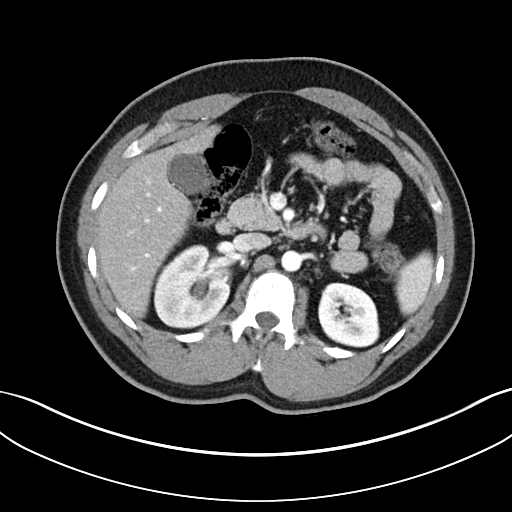
[im 80/102  soft-tissue]
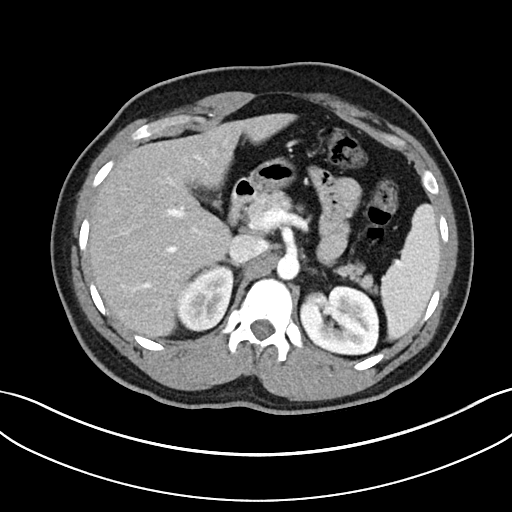
[im 86/102  soft-tissue]
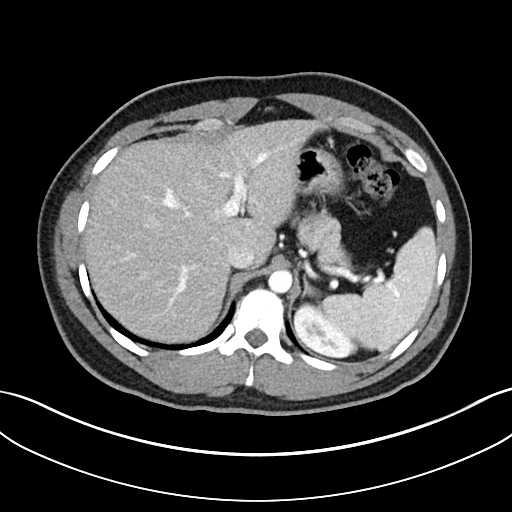
[im 96/102  soft-tissue]
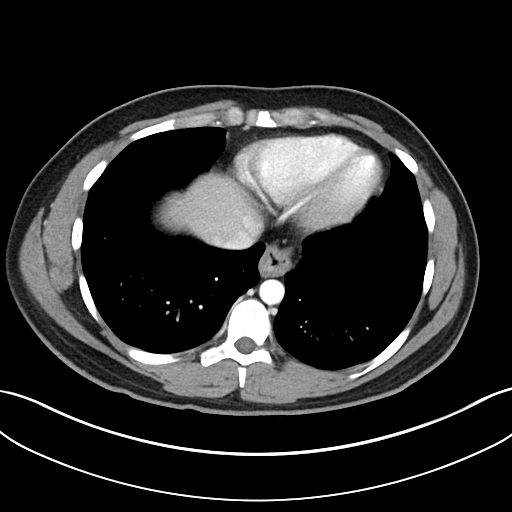

[Series 5: abdomen 3.0 mpr cor · coronal · 0.80mm/px · 3 of 92 slices shown]
[im 31/92  soft-tissue]
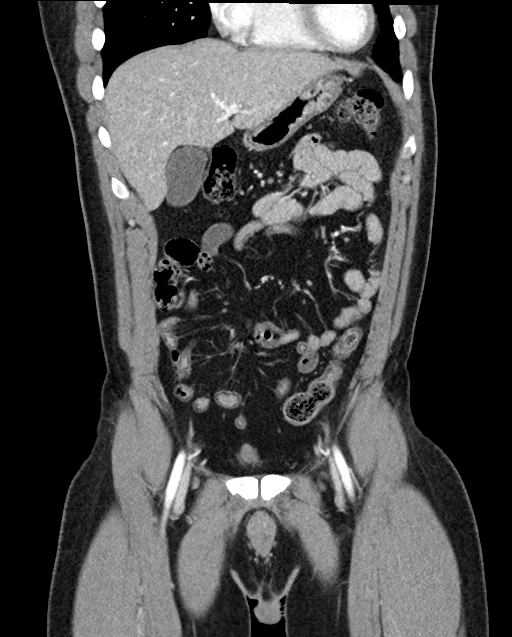
[im 41/92  soft-tissue]
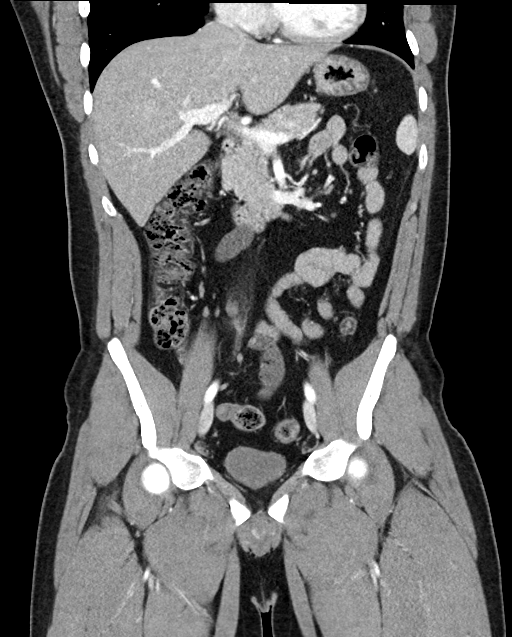
[im 51/92  soft-tissue]
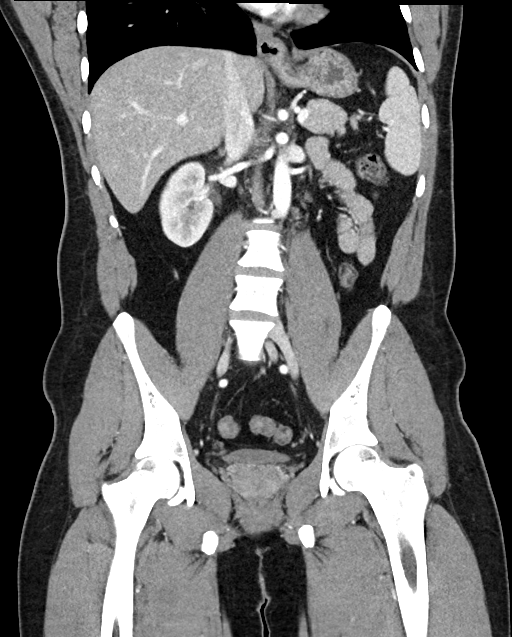

[16 of 46 positions shown; findings below may reference images not displayed]

RADIATION DOSE REDUCTION: This exam was performed according to the
departmental dose-optimization program which includes automated
exposure control, adjustment of the mA and/or kV according to
patient size and/or use of iterative reconstruction technique.

CONTRAST:  100mL OMNIPAQUE IOHEXOL 300 MG/ML SOLN
FINDINGS: Lower chest: No acute abnormality.

Hepatobiliary: No focal liver abnormality is seen. No gallstones,
gallbladder wall thickening, or biliary dilatation.

Pancreas: Unremarkable. No pancreatic ductal dilatation or
surrounding inflammatory changes.

Spleen: Normal in size without focal abnormality.

Adrenals/Urinary Tract: Normal adrenal glands. Mild right
hydronephrosis and slightly delayed right nephrogram. The proximal
ureter is dilated and there is Peri ureteral stranding. 6 x 3 mm
stone present in the mid ureter at the level of L4. The right kidney
is normal. Unremarkable appearance of the bladder. No additional
nephrolithiasis.

Stomach/Bowel: Stomach is within normal limits. Appendix appears
normal. No evidence of bowel wall thickening, distention, or
inflammatory changes.

Vascular/Lymphatic: No significant vascular findings are present. No
enlarged abdominal or pelvic lymph nodes.

Reproductive: Prostate is unremarkable.

Other: No abdominal wall hernia or abnormality. No abdominopelvic
ascites.

Musculoskeletal: No acute or significant osseous findings.
IMPRESSION: 1. At least partially obstructing 6 x 3 mm stone in the right mid
ureter with associated mild hydroureteronephrosis and peri ureteral
stranding.
2. No additional nephrolithiasis identified.
3. Normal appendix.
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
